# Patient Record
Sex: Male | Born: 1955 | Race: White | Hispanic: No | Marital: Single | Smoking: Never smoker
Health system: Southern US, Community
[De-identification: ages and names within clinical notes are randomized; demographics above are authoritative.]

---

## 2009-07-06 ENCOUNTER — Emergency Department: Payer: Self-pay | Admitting: Internal Medicine

## 2009-07-30 ENCOUNTER — Ambulatory Visit: Payer: Self-pay | Admitting: Orthopedic Surgery

## 2009-08-08 ENCOUNTER — Encounter: Payer: Self-pay | Admitting: Orthopedic Surgery

## 2009-08-23 ENCOUNTER — Encounter: Payer: Self-pay | Admitting: Orthopedic Surgery

## 2010-10-10 NOTE — ED Notes (Signed)
 After ambulating, pt tapped foot on chair and had worsening pain Splint checked - intact, toes pink, sensory intact and able to move them Will give percocet, okay for d/c home after med hold period

## 2010-10-14 ENCOUNTER — Ambulatory Visit: Payer: Self-pay | Admitting: Specialist

## 2010-10-29 ENCOUNTER — Ambulatory Visit: Payer: Self-pay | Admitting: Specialist

## 2011-04-22 ENCOUNTER — Other Ambulatory Visit: Payer: Self-pay | Admitting: Specialist

## 2011-04-28 LAB — WOUND CULTURE

## 2011-10-20 ENCOUNTER — Other Ambulatory Visit: Payer: Self-pay | Admitting: Specialist

## 2011-10-24 LAB — WOUND CULTURE

## 2011-12-29 ENCOUNTER — Ambulatory Visit: Payer: Self-pay | Admitting: Specialist

## 2012-01-04 ENCOUNTER — Ambulatory Visit: Payer: Self-pay | Admitting: Specialist

## 2012-03-27 IMAGING — CT CT EXTREM LOW W/O CM*L*
2 series · 15 of 26 positions shown, 17 images · non-contrast
Comparison: none

REASON FOR EXAM: fx calcaneous eval for surgery
COMMENTS:

PROCEDURE:     KCT - KCT HEEL(CANCANEOUS) LT WO  - October 14, 2010  [DATE]
RESULT:
TECHNIQUE: Multislice helical acquisition through the calcaneus is
reconstructed at high resolution bone window algorithms in the axial,
coronal and sagittal planes at 1.0 mm slice thickness with three dimensional
articulated MIP reconstructions performed. At the time of interpretation,
the [employer] Via software is utilized for additional thin slice reconstructions.

[Series 6: sagittal bone · sagittal · 0.29mm/px · 5 of 95 slices shown, 6 images]
[im 32/95  bone]
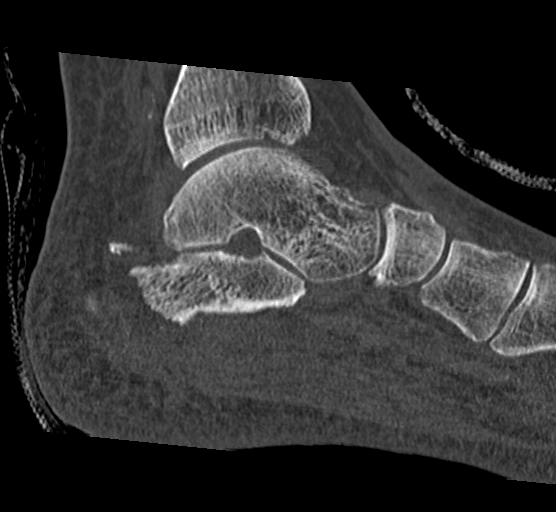
[im 40/95  bone]
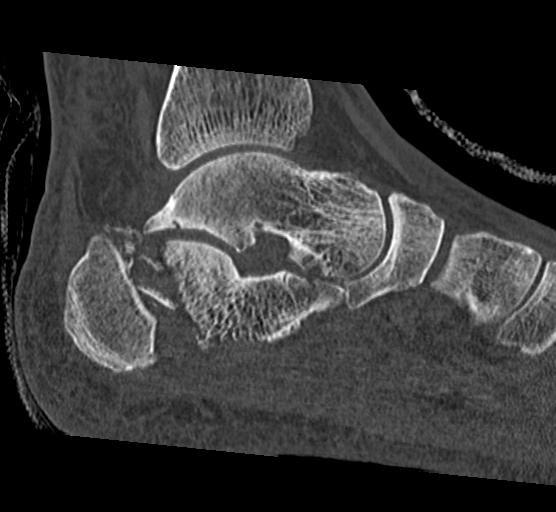
[im 48/95  soft-tissue]
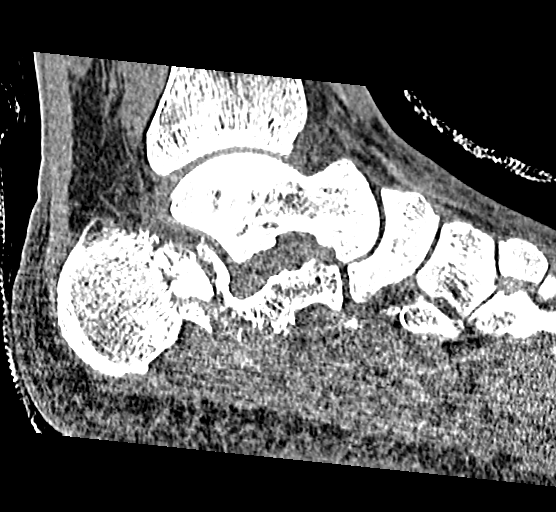
[im 48/95  bone]
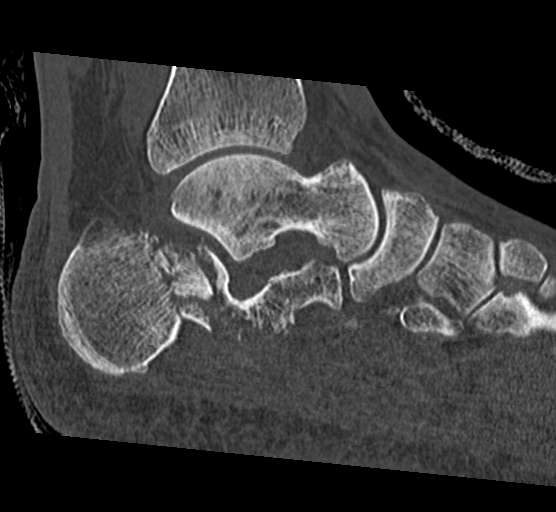
[im 55/95  bone]
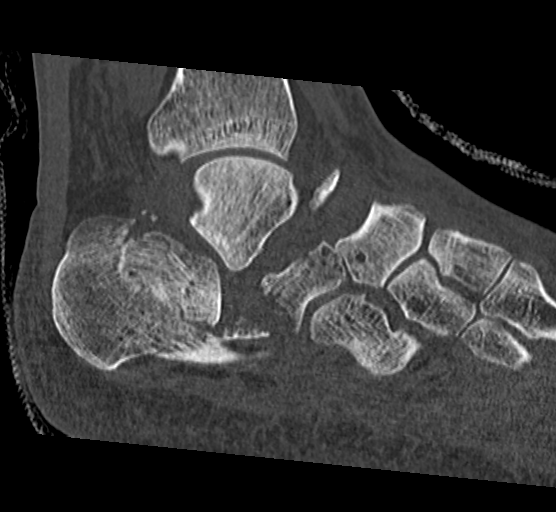
[im 63/95  bone]
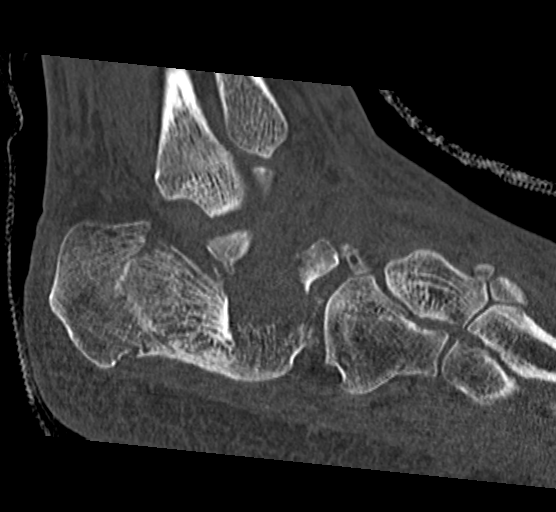

[Series 7: coronal bone · coronal · 0.25mm/px · 10 of 151 slices shown, 11 images]
[im 19/151  soft-tissue]
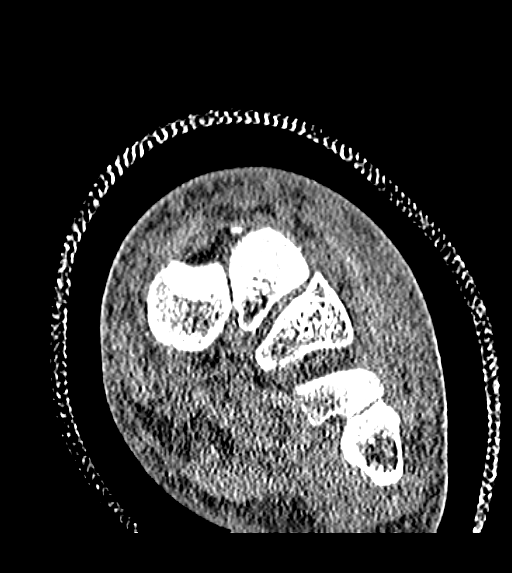
[im 19/151  bone]
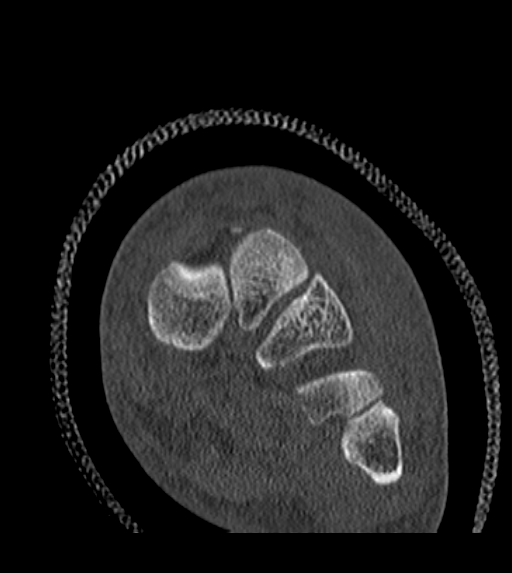
[im 31/151  bone]
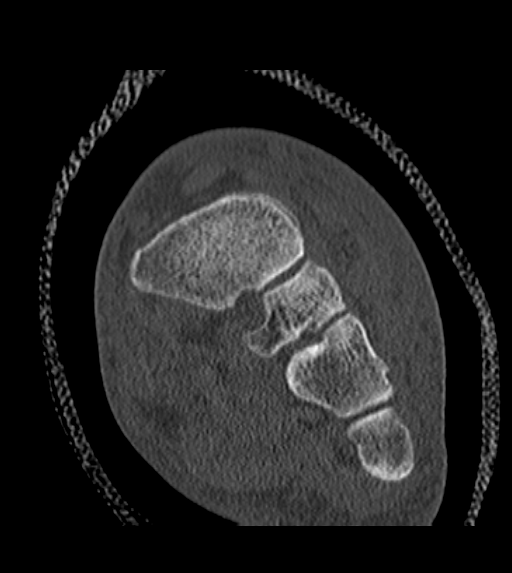
[im 38/151  bone]
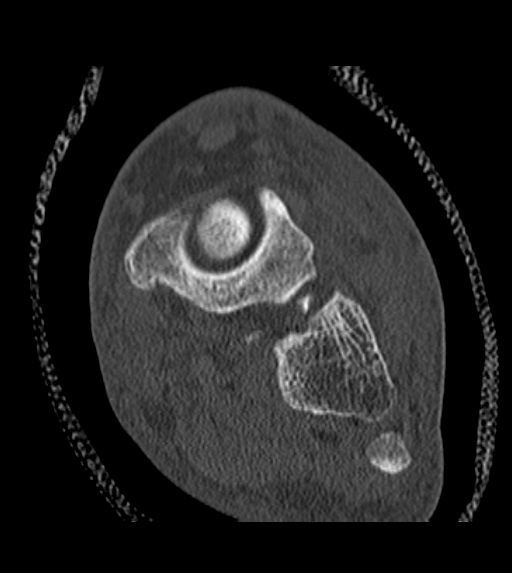
[im 57/151  bone]
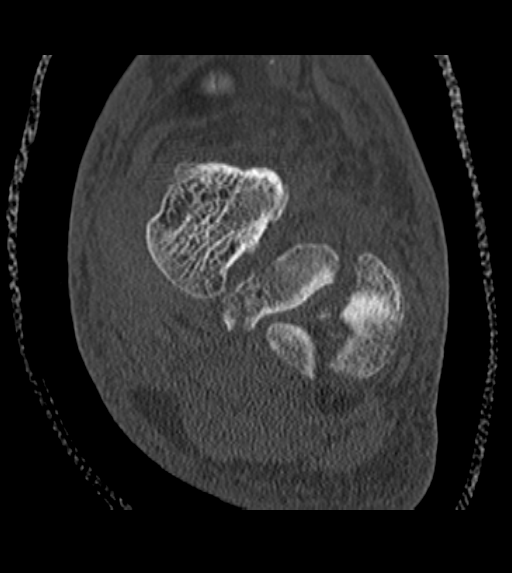
[im 61/151  bone]
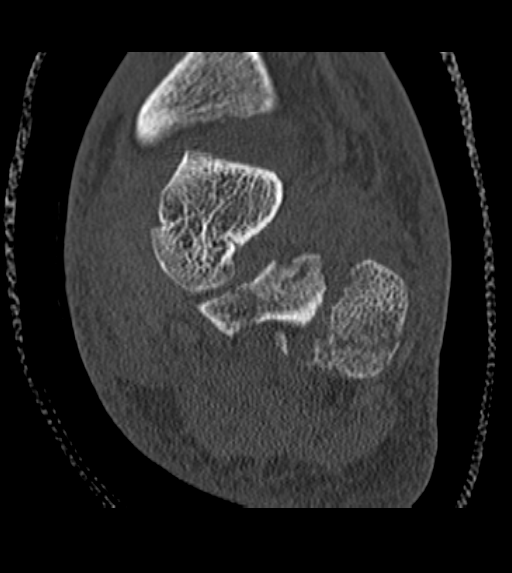
[im 91/151  bone]
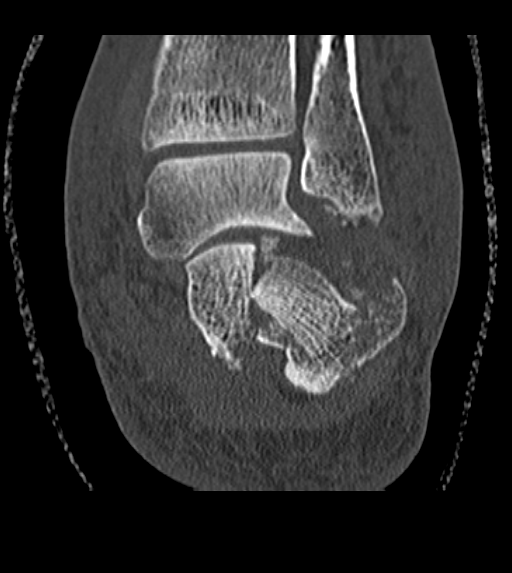
[im 94/151  bone]
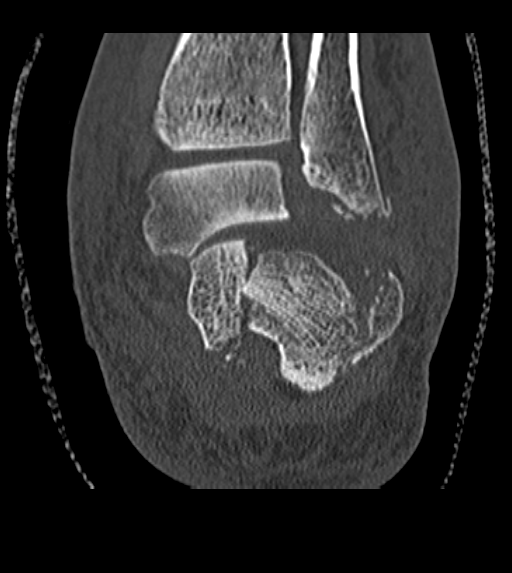
[im 113/151  bone]
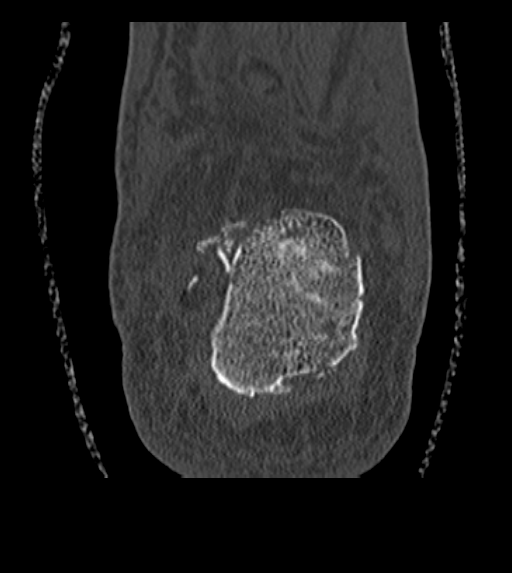
[im 121/151  bone]
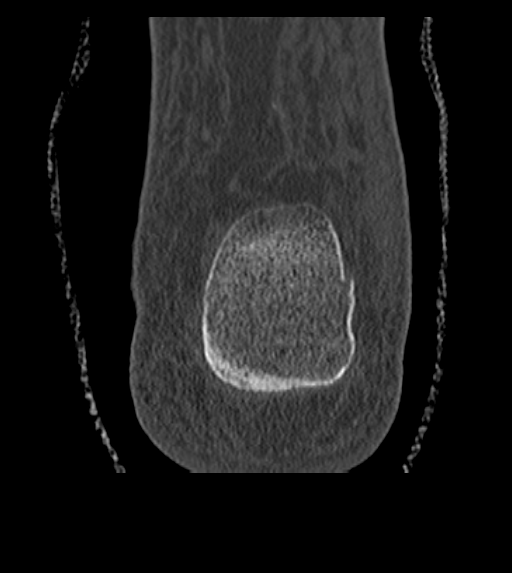
[im 132/151  bone]
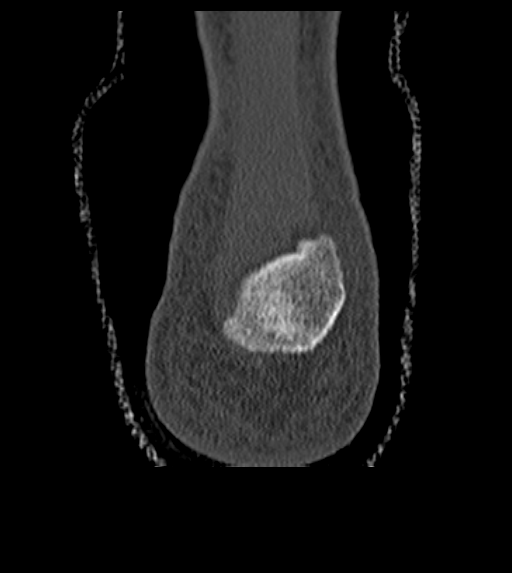

[15 of 26 positions shown; findings below may reference images not displayed]

FINDINGS: There is a severely comminuted fracture in the central portion of
the calcaneus with distraction especially laterally. Numerous, small,
comminuted fragments are demonstrated. There is some depression in the
central portion of the calcaneus inferiorly. There is some widening in a
portion of the subtalar region. The talus itself appears intact with
degenerative changes present. Tiny avulsed bony fragments are seen about the
distal aspect of the lateral malleolus. The other tarsal bones included on
the exam show some degenerative change without definite fracture. The
complex, comminuted fracture in the calcaneus extends to the superior,
inferior, medial, anterior and lateral surfaces without complete extension
to the posterior surface and minimal anterior articular surface involvement.
IMPRESSION: Complex, comminuted calcaneal fracture with avulsed
fragments adjacent to the lateral malleolus.

## 2012-04-11 IMAGING — CR DG HEEL 2V*L*
1 series · 2 of 2 positions shown · non-contrast
Comparison: none

REASON FOR EXAM: post op
COMMENTS:   Bedside (portable):Y

[Series 1: view not recorded · 0.17mm/px · 2 of 2 slices shown]
[im 1/2]
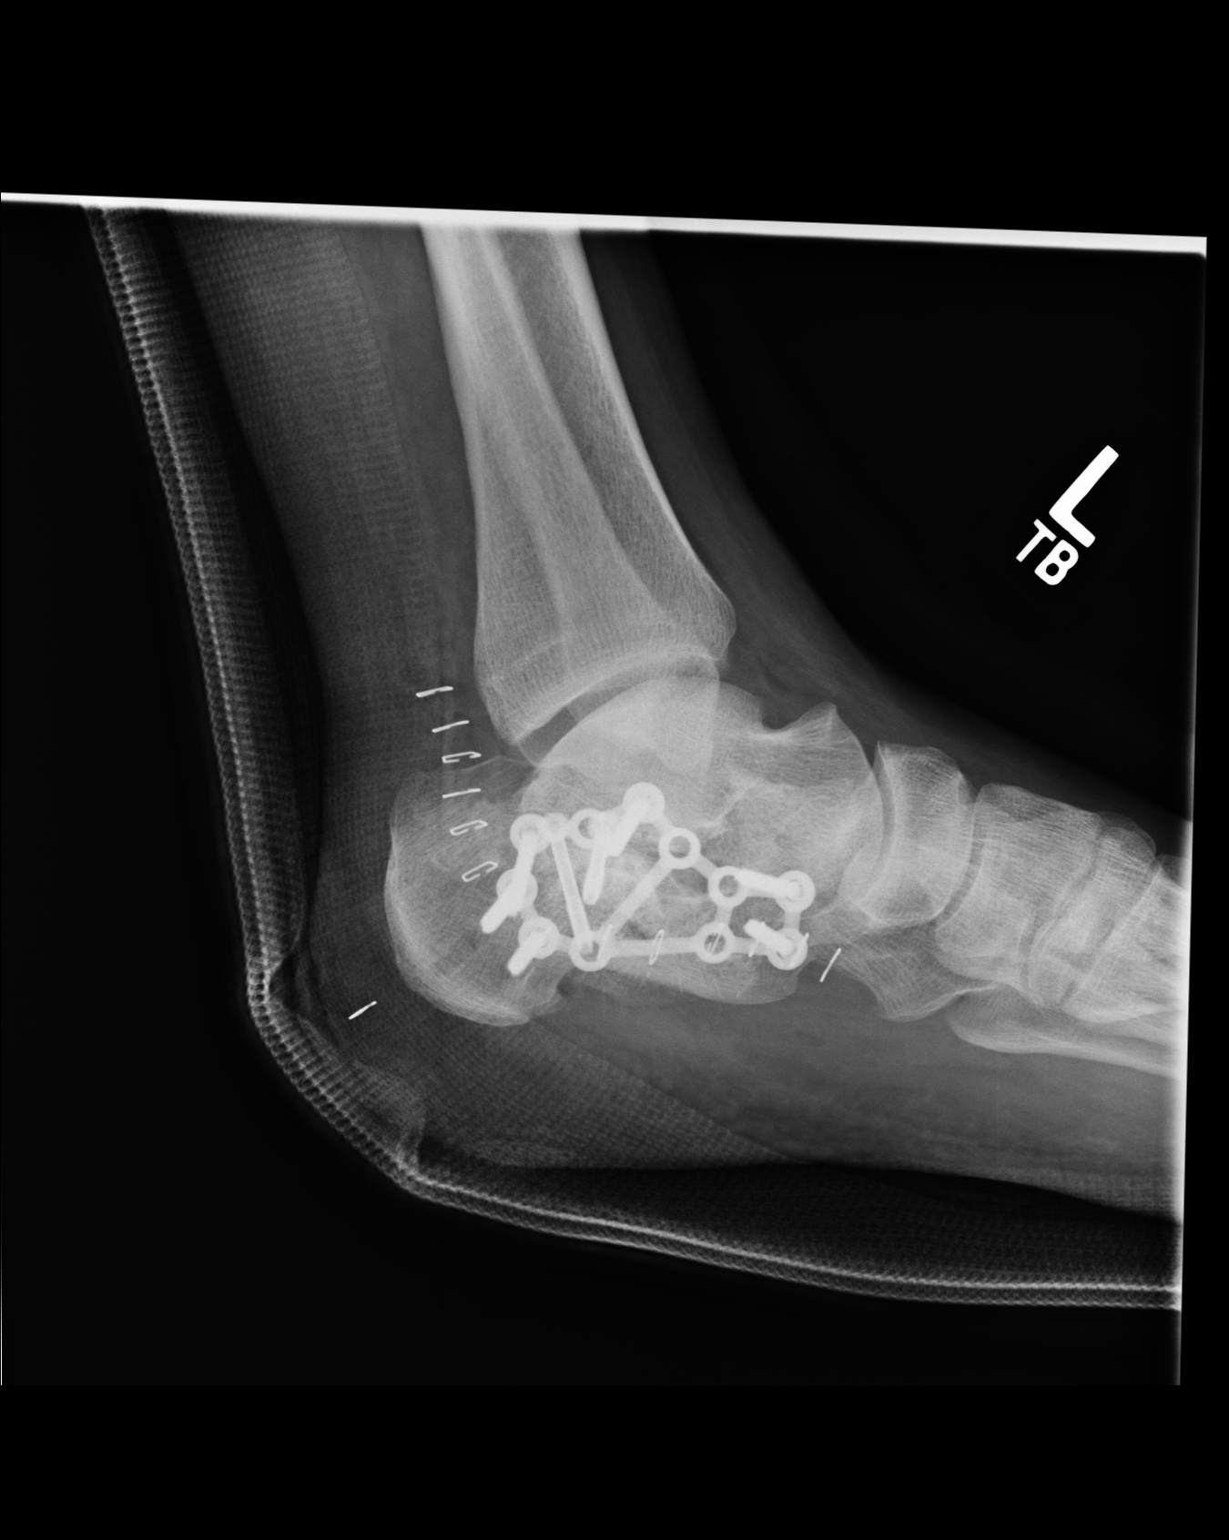
[im 2/2]
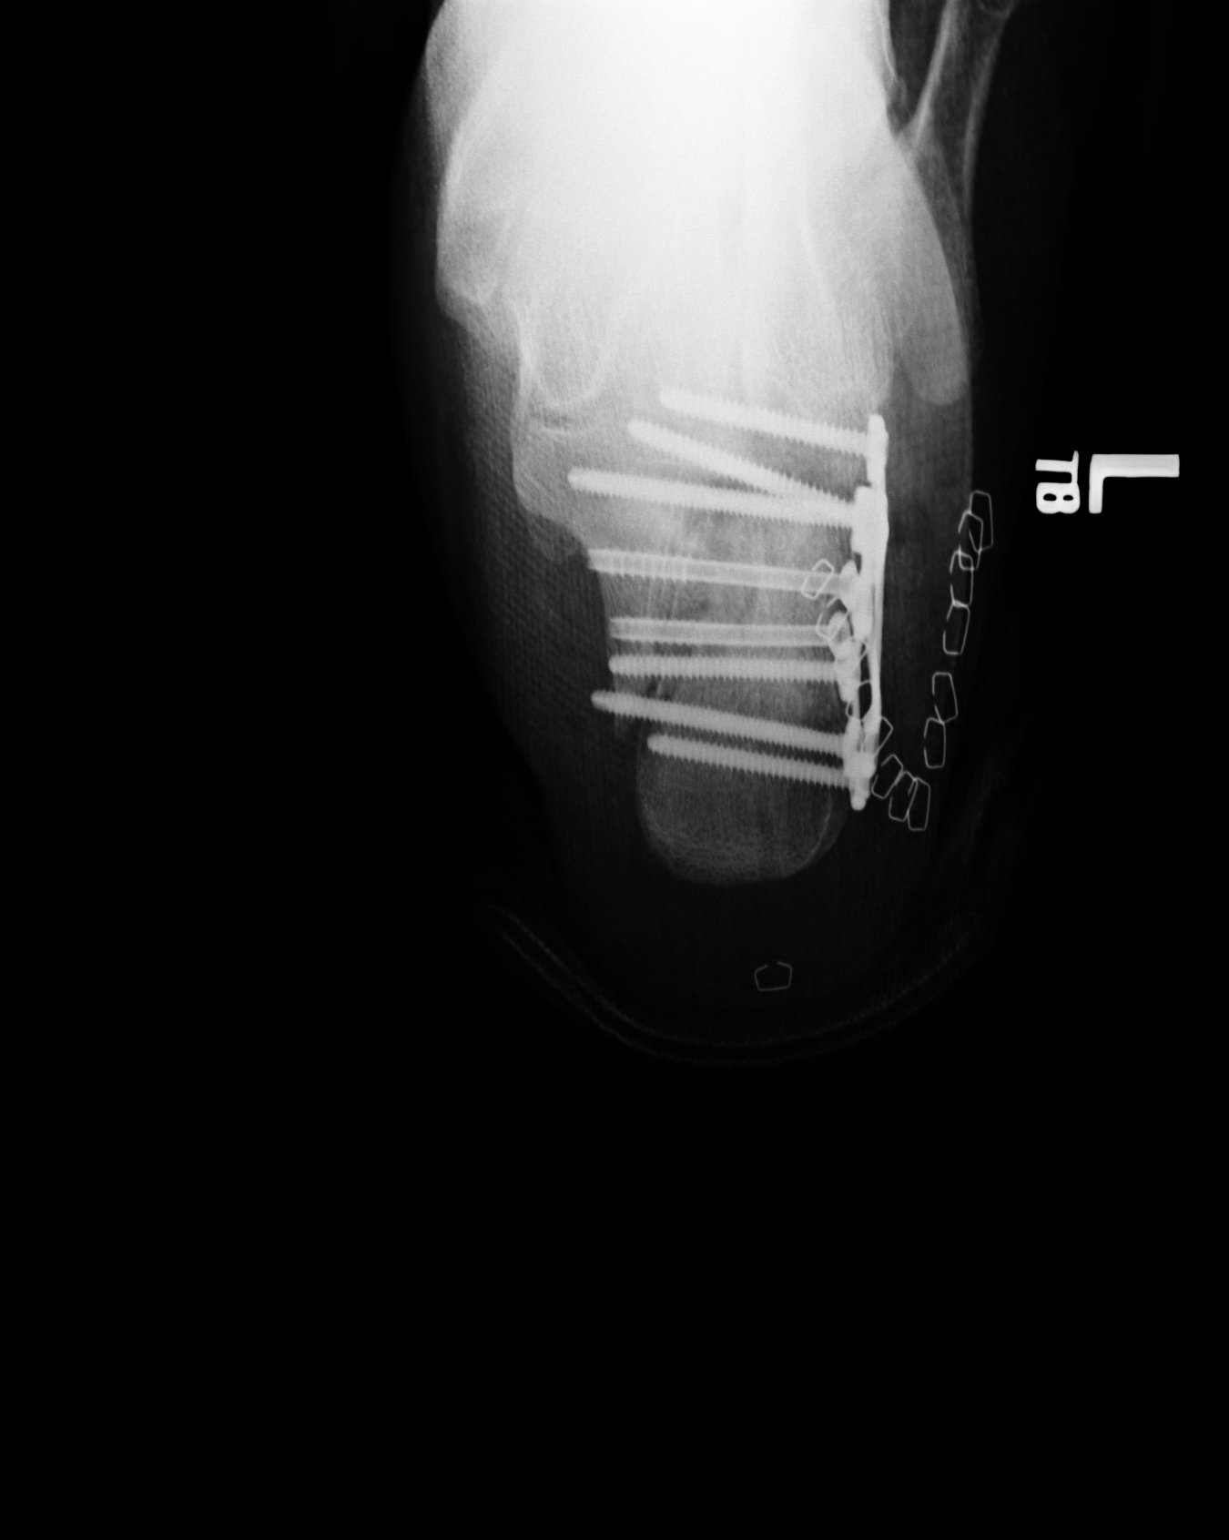

[2 of 2 positions shown; findings below may reference images not displayed]

PROCEDURE:     DXR - DXR CALCANEUOUS-HEEL LEFT  - October 29, 2010 [DATE]

RESULT:     Findings: 2 views of the left calcaneus demonstrates a malleable
sideplate with multiple interlocking screws transfixing a comminuted
calcaneal fracture. The fracture persists consistent with incomplete
healing. There is no other fracture or dislocation. There is a surgical
staple in the posterior calcaneal soft tissues.
IMPRESSION: Please see above.

## 2014-06-12 NOTE — Op Note (Signed)
PATIENT NAME:  John Oneal, Blayden E MR#:  [id number] DATE OF BIRTH:  03-Apr-1955  DATE OF PROCEDURE:  01/04/2012  PREOPERATIVE DIAGNOSIS: Painful hardware left calcaneus.   POSTOPERATIVE DIAGNOSIS: Painful hardware left calcaneus.   PROCEDURE PERFORMED: Deep hardware removal left calcaneus.   SURGEON: Valinda HoarHoward E. Taheem Fricke, M.D.   ANESTHESIA: General LMA.   COMPLICATIONS: None.   DRAINS: One vessel loop.   ESTIMATED BLOOD LOSS: Minimal.   REPLACEMENT: None.   DESCRIPTION OF PROCEDURE: The patient was brought to the operating room where he underwent satisfactory general LMA anesthesia in the supine position. A bolster was placed beneath the left hip and the left foot and lower leg were prepped and draped in sterile fashion. Esmarch was applied and the tourniquet was then inflated to 250 mmHg just above the ankle. A portion of the previous incision was used to re-incise down to bone sharply. The soft tissue was sharply debrided off the bone. The peroneal tendons were identified and were isolated from scar tissue and protected. The prior plate and screws were exposed and then the screws all removed sequentially. The plate was easily removed. Granulation tissue was removed with a rongeur. Two more screws were deep to the plate and these were removed as well. Posteriorly where the patient had had some intermittent drainage there was no sign of infection or other abnormality. A rongeur was used to remove any granulation tissue. The fracture was healed solidly. The subtalar joint was solid, did not move much. The wound was then irrigated and closed with 3-0 Vicryl over a vessel loop drain and 4-0 nylon in the skin. 0.5% Marcaine was placed in the soft tissues and a dry sterile compression dressing with posterior splint was applied. The tourniquet was deflated with good return of blood flow to the foot.   The patient was awakened and taken to recovery in good condition.   ____________________________ Valinda HoarHoward  E. Weslee Fogg, MD hem:bjt D: 01/04/2012 15:28:54 ET T: 01/04/2012 17:17:27 ET JOB#: [id number]  cc: Valinda HoarHoward E. Trula Frede, MD, <Dictator> Valinda HoarHOWARD E Jiles Goya MD ELECTRONICALLY SIGNED 01/05/2012 11:07

## 2020-12-12 NOTE — Progress Notes (Signed)
 Chief Complaint:   Chief Complaint  Patient presents with  . Annual Exam    Subjective:   John Oneal is a 65 y.o. male in today for his annual wellness visit and physical exam.  Medicare Wellness Visit   Providers Rendering Care Dr. Norleen [facility]  Functional Assessment (1) Hearing: Demonstrates no difficulty in hearing during normal conversation (2) Risk of Falls: Patient denies any falls or near falls in the last year (3) Home Safety: Patient feels secure in their home. There are operational smoke alarms in multiple areas of the home. (4) Activities of Daily Living: Independently manages personal grooming and household chores, including cooking, cleaning and laundry.  Manages Personal finances without assistance.    Depression Screening Denies loss of interest in normal activities, has no episodes of weeping or anxiety and reports no changes in appetite or sleep.  PHQ 2/9 from today's flowsheet  PHQ-2 PHQ-2 Over the last 2 weeks, how often have you been bothered by any of the following problems? Little interest or pleasure in doing things: Not At All Feeling down, depressed, or hopeless (or irritable for Teens only)?: Not At All Total Prescreening Score: 0   Total time spent on depression screening was approximately 15 minutes  PHQ-9 (if PHQ >=3) PHQ-9 Over the last 2 weeks, how often have you been bothered by any of the following problems? Total Score =: 0  Depression Severity and Treatment Recommendations:  0-4= None  5-9= Mild / Treatment: Support, educate to call if worse; return in one month  10-14= Moderate / Treatment: Support, watchful waiting; Antidepressant or Psychotherapy  15-19= Moderately severe / Treatment: Antidepressant OR Psychotherapy  >= 20 = Major depression, severe / Antidepressant AND Psychotherapy   Alcohol Screening: Alcohol Use: Not At Risk  . Frequency of Alcohol Consumption: Monthly or less  . Average Number  of Drinks: 1 or 2  . Frequency of Binge Drinking: Never   Total time spent on alcohol screening was approximately 15 minutes.   Cognitive impairment Oriented to person, place and time.  Responses appear appropriate and timely to this observer.   Prevention Plan  Item name                              Frequency        Month Due       Year Due Health Maintenance  Topic Date Due  . Hepatitis C Screen  Never done  . Colorectal Cancer Screening  Never done  . COVID-19 Vaccine (1) Never done  . Adult Tetanus (Td And Tdap)  Never done  . Shingrix (1 of 2) Never done  . Influenza Vaccine (1) 10/24/2020  . Medicare Initial or AWV  12/07/2020  . Pneumococcal Vaccine: 65+ (2 - PPSV23 or PCV20) 12/13/2021  . Depression Screening  12/13/2021  . PSA  12/14/2021  . Diabetes Screening  12/15/2022  . Hib Vaccines  Aged Out  . Hepatitis A Vaccines  Aged Out  . HPV Vaccines  Aged Out    Other personalized health advice Encouraged patient to exercise regularly with a target of 2.5 hours per week.  Encouraged attention to diet with good intake of fruits, vegetables, and limitation of red meat to 2 times a week or less    End of Life Counseling Patient does not have a living will in place.  Patient is a full code.       Current Outpatient  Medications  Medication Sig Dispense Refill  . aspirin 81 MG EC tablet Take 81 mg by mouth once daily    . docosahexaenoic acid/epa (FISH OIL ORAL) Take by mouth    . MULTIVITAMIN ORAL Take by mouth     No current facility-administered medications for this visit.    Allergies as of 12/13/2020  . (No Known Allergies)    History reviewed. No pertinent past medical history.  Past Surgical History:  Procedure Laterality Date  . Heel Surgery       History reviewed. No pertinent family history.  Social History:  reports that he has never smoked. He has never used smokeless tobacco. He reports current alcohol use. He reports previous drug  use.  Results for orders placed or performed in visit on 12/15/19  Comprehensive Metabolic Panel (CMP)  Result Value Ref Range   Glucose 89 70 - 110 mg/dL   Sodium 860 863 - 854 mmol/L   Potassium 4.3 3.6 - 5.1 mmol/L   Chloride 104 97 - 109 mmol/L   Carbon Dioxide (CO2) 28.6 22.0 - 32.0 mmol/L   Urea Nitrogen (BUN) 12 7 - 25 mg/dL   Creatinine 1.0 0.7 - 1.3 mg/dL   Glomerular Filtration Rate (eGFR), MDRD Estimate 75 >60 mL/min/1.73sq m   Calcium 9.4 8.7 - 10.3 mg/dL   AST  30 8 - 39 U/L   ALT  27 6 - 57 U/L   Alk Phos (alkaline Phosphatase) 73 34 - 104 U/L   Albumin 4.2 3.5 - 4.8 g/dL   Bilirubin, Total 0.6 0.3 - 1.2 mg/dL   Protein, Total 6.7 6.1 - 7.9 g/dL   A/G Ratio 1.7 1.0 - 5.0 gm/dL  CBC w/auto Differential (5 Part)  Result Value Ref Range   WBC (White Blood Cell Count) 10.3 (H) 4.1 - 10.2 10^3/uL   RBC (Red Blood Cell Count) 4.95 4.69 - 6.13 10^6/uL   Hemoglobin 14.8 14.1 - 18.1 gm/dL   Hematocrit 55.5 59.9 - 52.0 %   MCV (Mean Corpuscular Volume) 89.7 80.0 - 100.0 fl   MCH (Mean Corpuscular Hemoglobin) 29.9 27.0 - 31.2 pg   MCHC (Mean Corpuscular Hemoglobin Concentration) 33.3 32.0 - 36.0 gm/dL   Platelet Count 820 849 - 450 10^3/uL   RDW-CV (Red Cell Distribution Width) 13.5 11.6 - 14.8 %   MPV (Mean Platelet Volume) 10.8 9.4 - 12.4 fl   Neutrophils 6.53 1.50 - 7.80 10^3/uL   Lymphocytes 2.66 1.00 - 3.60 10^3/uL   Monocytes 0.89 0.00 - 1.50 10^3/uL   Eosinophils 0.17 0.00 - 0.55 10^3/uL   Basophils 0.04 0.00 - 0.09 10^3/uL   Neutrophil % 63.4 32.0 - 70.0 %   Lymphocyte % 25.8 10.0 - 50.0 %   Monocyte % 8.6 4.0 - 13.0 %   Eosinophil % 1.6 1.0 - 5.0 %   Basophil% 0.4 0.0 - 2.0 %   Immature Granulocyte % 0.2 <=0.7 %   Immature Granulocyte Count 0.02 <=0.06 10^3/L  Lipid Panel w/calc LDL  Result Value Ref Range   Cholesterol, Total 209 (H) 100 - 200 mg/dL   Triglyceride 850 35 - 199 mg/dL   HDL (High Density Lipoprotein) Cholesterol 34.6 29.0 - 71.0 mg/dL    LDL Calculated 854 (H) 0 - 130 mg/dL   VLDL Cholesterol 30 mg/dL   Cholesterol/HDL Ratio 6.0   Urinalysis w/Microscopic  Result Value Ref Range   Color Yellow Yellow   Clarity Clear Clear   Specific Gravity <=1.005 1.000 - 1.030   pH,  Urine 6.0 5.0 - 8.0   Protein, Urinalysis Negative Negative, Trace mg/dL   Glucose, Urinalysis Negative Negative mg/dL   Ketones, Urinalysis Negative Negative mg/dL   Blood, Urinalysis Negative Negative   Nitrite, Urinalysis Negative Negative   Leukocyte Esterase, Urinalysis Trace (!) Negative   White Blood Cells, Urinalysis 0-3 None Seen, 0-3 /hpf   Red Blood Cells, Urinalysis None Seen None Seen, 0-3 /hpf   Bacteria, Urinalysis None Seen None Seen /hpf   Squamous Epithelial Cells, Urinalysis Rare Rare, Few, None Seen /hpf  PSA, Total (Screen)  Result Value Ref Range   PSA (Prostate Specific Antigen), Total 0.58 0.10 - 4.00 ng/mL   Narrative   Test results were determined with Beckman Coulter Hybritech Assay. Values obtained with different assay methods cannot be used interchangeably in serial testing. Assay results should not be interpreted as absolute evidence of the presence or absence of malignant disease  Hemoglobin A1C  Result Value Ref Range   Hemoglobin A1C 5.9 (H) 4.2 - 5.6 %   Average Blood Glucose (Calc) 123 mg/dL   Narrative   Normal Range:    4.2 - 5.6% Increased Risk:  5.7 - 6.4% Diabetes:        >= 6.5% Glycemic Control for adults with diabetes:  <7%        ROS:  General: No fever, chills or recent illness. No change in weight Skin:   No skin lesions, growths, masses, rashes, pruritus  HEENT: No change in vision or hearing. No pain or difficulty with swallowing Respiratory: No cough or shortness of breath CV:  No chest pain or palpitations GI:  No pain, dyspepsia or change in bowel habits GU:  No dysuria, frequency, or hesitancy MSK:  No joint pain or injury Neurological: No headaches, changes in mental status, loss of  sensation or strength Endocrine:  No heat or cold intolerance, polydipsia, polyuria  Objective:   Body mass index is 38.91 kg/m.  BP 134/82   Pulse 76   Ht 177.3 cm (5' 9.8)   Wt (!) 122.3 kg (269 lb 9.6 oz)   SpO2 94%   BMI 38.91 kg/m   General: WD/WN male, in no acute distress HEENT: Pupils equal and round, EOMI. oral mucosa moist.  Oropharynx clear. Neck: supple, trachea midline; no thyromegaly Respiratory:clear to auscultation.  No dullness to percussion.  No use of accessory muscles. Cardiac:  Regular rate and rhythm without murmur, gallops, or rubs Vascular: Carotid and radials 2+; distal pulses 2+ Abdominal:soft, nontender, positive bowel sounds.  No organomegaly. Musculoskeletal:  No clubbing, cyanosis or edema.  Full range of motion in upper and lower extremities bilaterally. Neuro: CN grossly intact.  No acute decrease in sensation in the upper and lower extremities bilaterally. Integumental: Moist with no significant rashes or nodules Lymph: no cervical or supraclavicular lymphadenopathy   Assessment/Plan:   Medicare annual wellness visit, initial  (primary encounter diagnosis) Encounter for routine adult physical exam with abnormal findings Need for vaccination Colon cancer screening  Assessment and Plan  1.  Annual wellness visit.  See above documentation. 2.  Annual physical exam.  Referred him for colonoscopy otherwise up-to-date on health maintenance.  Will get screening labs today.    Goals    .  Maintain health/healthy lifestyle (pt-stated)       NORLEEN ALM ROWER, MD  Portions of this note were created using dictation software and may contain typographical errors.

## 2023-01-06 NOTE — Progress Notes (Signed)
 Chief Complaint:   Chief Complaint  Patient presents with  . Annual Exam    Subjective:   John Oneal is a 67 y.o. male in today for his annual wellness visit and physical exam.  Medicare Wellness Visit   Providers Rendering Care Dr. Norleen Kick [facility]  Functional Assessment (1) Hearing: Demonstrates no difficulty in hearing during normal conversation (2) Risk of Falls: Patient denies any falls or near falls in the last year (3) Home Safety: Patient feels secure in their home. There are operational smoke alarms in multiple areas of the home. (4) Activities of Daily Living: Independently manages personal grooming and household chores, including cooking, cleaning and laundry.  Manages Personal finances without assistance.    Depression Screening Denies loss of interest in normal activities, has no episodes of weeping or anxiety and reports no changes in appetite or sleep.  PHQ 2/9 from today's flowsheet  PHQ-2 PHQ-2 Over the last 2 weeks, how often have you been bothered by any of the following problems? Little interest or pleasure in doing things: Not at all Feeling down, depressed, or hopeless: Not at all Patient Health Questionnaire-2 Score: 0  PHQ-9 (if PHQ >=3)    Depression Severity and Treatment Recommendations:  0-4= None  5-9= Mild / Treatment: Support, educate to call if worse; return in one month  10-14= Moderate / Treatment: Support, watchful waiting; Antidepressant or Psychotherapy  15-19= Moderately severe / Treatment: Antidepressant OR Psychotherapy  >= 20 = Major depression, severe / Antidepressant AND Psychotherapy   Alcohol Screening: Alcohol Use: Not At Risk (01/08/2023)   AUDIT-C   . Frequency of Alcohol Consumption: Monthly or less   . Average Number of Drinks: 1 or 2   . Frequency of Binge Drinking: Never   Total time spent on alcohol screening was approximately 15 minutes.   Cognitive impairment Oriented to person,  place and time.  Responses appear appropriate and timely to this observer.   Prevention Plan  Item name                              Frequency        Month Due       Year Due Health Maintenance  Topic Date Due  . Hepatitis C Screen  Never done  . Colorectal Cancer Screening  Never done  . Adult Tetanus (Td And Tdap)  Never done  . Shingrix (1 of 2) Never done  . Pneumococcal Vaccine: 65+ (2 of 2 - PPSV23 or PCV20) 12/13/2021  . COVID-19 Vaccine (1 - 2024-25 season) Never done  . Medicare Initial or AWV  01/03/2023  . Depression Screening  01/08/2024  . PSA  01/05/2025  . Lipid Panel  01/06/2028  . RSV Immunization Pregnant or 60+ (1 - 1-dose 75+ series) 07/29/2029  . Influenza Vaccine  Completed  . Hib Vaccines  Aged Out  . Hepatitis A Vaccines  Aged Out  . Meningococcal ACWY Vaccine  Aged Out  . HPV Vaccines  Aged Out    Other personalized health advice Encouraged patient to exercise regularly with a target of 2.5 hours per week.  Encouraged attention to diet with good intake of fruits, vegetables, and limitation of red meat to 2 times a week or less    End of Life Counseling Patient has a living will in place.  Patient is a full code.       Current Outpatient Medications  Medication Sig Dispense  Refill  . aspirin 81 MG EC tablet Take 81 mg by mouth once daily    . docosahexaenoic acid/epa (FISH OIL ORAL) Take by mouth    . MULTIVITAMIN ORAL Take by mouth    . rosuvastatin (CRESTOR) 10 MG tablet Take 1 tablet (10 mg total) by mouth once daily 30 tablet 11  . UNKNOWN TO PATIENT 3 different eye drops (after cataract surgery)     No current facility-administered medications for this visit.    Allergies as of 01/08/2023  . (No Known Allergies)    Past Medical History:  Diagnosis Date  . Hyperlipidemia 01/02/2022    Past Surgical History:  Procedure Laterality Date  . CATARACT EXTRACTION     Oct 13th and Nov 13th 2024  . Heel Surgery       No family history  on file.  Social History:  reports that he has never smoked. He has never used smokeless tobacco. He reports current alcohol use. He reports that he does not currently use drugs.  Results for orders placed or performed in visit on 01/06/23  Comprehensive Metabolic Panel (CMP)  Result Value Ref Range   Glucose 92 70 - 110 mg/dL   Sodium 857 863 - 854 mmol/L   Potassium 4.5 3.6 - 5.1 mmol/L   Chloride 106 97 - 109 mmol/L   Carbon Dioxide (CO2) 28.6 22.0 - 32.0 mmol/L   Urea Nitrogen (BUN) 14 7 - 25 mg/dL   Creatinine 1.0 0.7 - 1.3 mg/dL   Glomerular Filtration Rate (eGFR) 82 >60 mL/min/1.73sq m   Calcium 9.3 8.7 - 10.3 mg/dL   AST  31 8 - 39 U/L   ALT  19 6 - 57 U/L   Alk Phos (alkaline Phosphatase) 73 34 - 104 U/L   Albumin 4.3 3.5 - 4.8 g/dL   Bilirubin, Total 0.7 0.3 - 1.2 mg/dL   Protein, Total 6.9 6.1 - 7.9 g/dL   A/G Ratio 1.7 1.0 - 5.0 gm/dL  CBC w/auto Differential (5 Part)  Result Value Ref Range   WBC (White Blood Cell Count) 8.4 4.1 - 10.2 10^3/uL   RBC (Red Blood Cell Count) 4.92 4.69 - 6.13 10^6/uL   Hemoglobin 15.1 14.1 - 18.1 gm/dL   Hematocrit 56.2 59.9 - 52.0 %   MCV (Mean Corpuscular Volume) 88.8 80.0 - 100.0 fl   MCH (Mean Corpuscular Hemoglobin) 30.7 27.0 - 31.2 pg   MCHC (Mean Corpuscular Hemoglobin Concentration) 34.6 32.0 - 36.0 gm/dL   Platelet Count 820 849 - 450 10^3/uL   RDW-CV (Red Cell Distribution Width) 13.9 11.6 - 14.8 %   MPV (Mean Platelet Volume) 10.1 9.4 - 12.4 fl   Neutrophils 5.55 1.50 - 7.80 10^3/uL   Lymphocytes 1.81 1.00 - 3.60 10^3/uL   Monocytes 0.69 0.00 - 1.50 10^3/uL   Eosinophils 0.27 0.00 - 0.55 10^3/uL   Basophils 0.04 0.00 - 0.09 10^3/uL   Neutrophil % 66.4 32.0 - 70.0 %   Lymphocyte % 21.6 10.0 - 50.0 %   Monocyte % 8.2 4.0 - 13.0 %   Eosinophil % 3.2 1.0 - 5.0 %   Basophil% 0.5 0.0 - 2.0 %   Immature Granulocyte % 0.1 <=0.7 %   Immature Granulocyte Count 0.01 <=0.06 10^3/L  Hemoglobin A1C  Result Value Ref Range    Hemoglobin A1C 6.0 (H) 4.2 - 5.6 %   Average Blood Glucose (Calc) 126 mg/dL   Narrative   Normal Range:    4.2 - 5.6% Increased Risk:  5.7 -  6.4% Diabetes:        >= 6.5% Glycemic Control for adults with diabetes:  <7%    Lipid Panel w/calc LDL  Result Value Ref Range   Cholesterol, Total 164 100 - 200 mg/dL   Triglyceride 866 35 - 199 mg/dL   HDL (High Density Lipoprotein) Cholesterol 35.1 29.0 - 71.0 mg/dL   LDL Calculated 897 0 - 130 mg/dL   VLDL Cholesterol 27 mg/dL   Cholesterol/HDL Ratio 4.7   Urinalysis w/Microscopic  Result Value Ref Range   Color Yellow Colorless, Straw, Light Yellow, Yellow, Dark Yellow   Clarity Clear Clear   Specific Gravity 1.028 1.005 - 1.030   pH, Urine 6.0 5.0 - 8.0   Protein, Urinalysis Trace (!) Negative mg/dL   Glucose, Urinalysis Negative Negative mg/dL   Ketones, Urinalysis Negative Negative mg/dL   Blood, Urinalysis Negative Negative   Nitrite, Urinalysis Negative Negative   Leukocyte Esterase, Urinalysis 1+ (!) Negative   Bilirubin, Urinalysis Negative Negative   Urobilinogen, Urinalysis 2.0 (H) 0.2 - 1.0 mg/dL   WBC, UA 14 (H) <=5 /hpf   Red Blood Cells, Urinalysis 1 <=3 /hpf   Bacteria, Urinalysis 0-5 0 - 5 /hpf   Squamous Epithelial Cells, Urinalysis 0 /hpf   Calcium Oxalate Crystals PRESENT (!) None Seen  PSA, Total (Screen)  Result Value Ref Range   PSA (Prostate Specific Antigen), Total 3.04 0.10 - 4.00 ng/mL   Narrative   Test results were determined with Beckman Coulter Hybritech Assay. Values obtained with different assay methods cannot be used interchangeably in serial testing. Assay results should not be interpreted as absolute evidence of the presence or absence of malignant disease      ROS:  General: No fever, chills or recent illness. No change in weight Skin:   No skin lesions, growths, masses, rashes, pruritus  HEENT: No change in vision or hearing. No pain or difficulty with swallowing Respiratory: No cough or  shortness of breath CV:  No chest pain or palpitations GI:  No pain, dyspepsia or change in bowel habits GU:  No dysuria, frequency, or hesitancy MSK:  No joint pain or injury Neurological: No headaches, changes in mental status, loss of sensation or strength Endocrine:  No heat or cold intolerance, polydipsia, polyuria  Objective:   Body mass index is 39.4 kg/m.  BP 130/60   Pulse 75   Ht 177.3 cm (5' 9.8)   Wt (!) 123.8 kg (273 lb)   SpO2 96%   BMI 39.40 kg/m   General: WD/WN male, in no acute distress HEENT: Pupils equal and round, EOMI. oral mucosa moist.  Oropharynx clear. Neck: supple, trachea midline; no thyromegaly Respiratory:clear to auscultation.  No dullness to percussion.  No use of accessory muscles. Cardiac:  Regular rate and rhythm without murmur, gallops, or rubs Vascular: Carotid and radials 2+; distal pulses 2+ Abdominal:soft, nontender, positive bowel sounds.  No organomegaly. Musculoskeletal:  No clubbing, cyanosis or edema.  Full range of motion in upper and lower extremities bilaterally. Neuro: CN grossly intact.  No acute decrease in sensation in the upper and lower extremities bilaterally. Integumental: Moist with no significant rashes or nodules Lymph: no cervical or supraclavicular lymphadenopathy   Assessment/Plan:   Medicare annual wellness visit, subsequent  (primary encounter diagnosis) Encounter for routine adult physical exam with abnormal findings Hyperlipidemia, unspecified hyperlipidemia type Prediabetes Need for immunization against influenza  Assessment and Plan  1.  Annual wellness visit.  See above documentation. 2.  Annual physical exam.  He has a Cologuard test at home that his insurance company sent him.  Otherwise up-to-date with health maintenance.  Reviewed lab results. 3.  Hyperlipidemia.  Lipid panel is within parameters.  Continue statin therapy. 4.  Prediabetes.  Hemoglobin A1c is 6.0.    Goals     . * Maintain  health/healthy lifestyle (pt-stated)    . * Maintain health/healthy lifestyle (pt-stated)       Goals       Patient Stated   . * Maintain health/healthy lifestyle (pt-stated)               NORLEEN ALM ROWER, MD  Portions of this note were created using dictation software and may contain typographical errors.  *Some images could not be shown.

## 2024-01-13 NOTE — Progress Notes (Signed)
 Chief Complaint:   Chief Complaint  Patient presents with  . Annual Exam    Subjective:   John Oneal is a 68 y.o. male in today for his annual wellness visit and physical exam.  Medicare Wellness Visit   Providers Rendering Care Dr. Norleen Kick [facility]  Functional Assessment (1) Hearing: Demonstrates no difficulty in hearing during normal conversation (2) Risk of Falls: Patient denies any falls or near falls in the last year (3) Home Safety: Patient feels secure in their home. There are operational smoke alarms in multiple areas of the home. (4) Activities of Daily Living: Independently manages personal grooming and household chores, including cooking, cleaning and laundry.  Manages Personal finances without assistance.    Depression Screening Denies loss of interest in normal activities, has no episodes of weeping or anxiety and reports no changes in appetite or sleep.  PHQ 2/9 from today's flowsheet  PHQ-2 PHQ-2 Over the last 2 weeks, how often have you been bothered by any of the following problems? Little interest or pleasure in doing things: Not at all Feeling down, depressed, or hopeless: Not at all Patient Health Questionnaire-2 Score: 0  PHQ-9 (if PHQ >=3)    Depression Severity and Treatment Recommendations:  0-4= None  5-9= Mild / Treatment: Support, educate to call if worse; return in one month  10-14= Moderate / Treatment: Support, watchful waiting; Antidepressant or Psychotherapy  15-19= Moderately severe / Treatment: Antidepressant OR Psychotherapy  >= 20 = Major depression, severe / Antidepressant AND Psychotherapy   Alcohol Screening: Alcohol Use: Not At Risk (01/14/2024)   AUDIT-C   . Frequency of Alcohol Consumption: Monthly or less   . Average Number of Drinks: 1 or 2   . Frequency of Binge Drinking: Never   Total time spent on alcohol screening was approximately 15 minutes.   Cognitive impairment Oriented to person,  place and time.  Responses appear appropriate and timely to this observer.   Prevention Plan  Item name                              Frequency        Month Due       Year Due Health Maintenance  Topic Date Due  . Hepatitis C Screen  Never done  . Colorectal Cancer Screening  Never done  . Adult Tetanus (Td And Tdap)  Never done  . Shingrix (1 of 2) Never done  . Pneumococcal Vaccine: 50+ (2 of 2 - PCV20 or PCV21) 12/13/2021  . COVID-19 Vaccine (1 - 2025-26 season) Never done  . Medicare Subsequent AWV [id number]  01/09/2024  . Annual Physical/Well Child Check  01/09/2024  . Depression Screening  01/13/2025  . PSA  01/06/2026  . Lipid Panel  01/06/2029  . RSV Immunization Pregnant or 50+ (1 - 1-dose 75+ series) 07/29/2029  . Medicare Initial AWV [id number]  Completed  . Influenza Vaccine  Completed  . Hib Vaccines  Aged Out  . Hepatitis A Vaccines  Aged Out  . Meningococcal B Vaccine  Aged Out  . Meningococcal ACWY Vaccine  Aged Out  . HPV Vaccines  Aged Out    Other personalized health advice Encouraged patient to exercise regularly with a target of 2.5 hours per week.  Encouraged attention to diet with good intake of fruits, vegetables, and limitation of red meat to 2 times a week or less    End of Life Counseling Patient has  a living will in place.  Patient is a full code.       Current Outpatient Medications  Medication Sig Dispense Refill  . aspirin 81 MG EC tablet Take 81 mg by mouth once daily    . docosahexaenoic acid/epa (FISH OIL ORAL) Take by mouth    . MULTIVITAMIN ORAL Take by mouth    . rosuvastatin (CRESTOR) 10 MG tablet Take 1 tablet (10 mg total) by mouth once daily 30 tablet 11  . UNKNOWN TO PATIENT 3 different eye drops (after cataract surgery)     No current facility-administered medications for this visit.    Allergies as of 01/14/2024  . (No Known Allergies)    Past Medical History:  Diagnosis Date  . Hyperlipidemia 01/02/2022    Past Surgical  History:  Procedure Laterality Date  . CATARACT EXTRACTION     Oct 13th and Nov 13th 2024  . Heel Surgery       No family history on file.  Social History:  reports that he has never smoked. He has never used smokeless tobacco. He reports current alcohol use. He reports that he does not currently use drugs.  Results for orders placed or performed in visit on 01/07/24  Comprehensive Metabolic Panel (CMP)  Result Value Ref Range   Glucose 104 70 - 110 mg/dL   Sodium 859 863 - 854 mmol/L   Potassium 4.2 3.6 - 5.1 mmol/L   Chloride 106 97 - 109 mmol/L   Carbon Dioxide (CO2) 26.5 22.0 - 32.0 mmol/L   Urea Nitrogen (BUN) 14 7 - 25 mg/dL   Creatinine 1.0 0.7 - 1.3 mg/dL   Glomerular Filtration Rate (eGFR) 81 >60 mL/min/1.73sq m   Calcium 9.0 8.7 - 10.3 mg/dL   AST  21 8 - 39 U/L   ALT  16 6 - 57 U/L   Alk Phos (alkaline Phosphatase) 74 34 - 104 U/L   Albumin 4.3 3.5 - 4.8 g/dL   Bilirubin, Total 0.8 0.3 - 1.2 mg/dL   Protein, Total 6.7 6.1 - 7.9 g/dL   A/G Ratio 1.8 1.0 - 5.0 gm/dL  CBC w/auto Differential (5 Part)  Result Value Ref Range   WBC (White Blood Cell Count) 8.0 4.1 - 10.2 10^3/uL   RBC (Red Blood Cell Count) 4.98 4.69 - 6.13 10^6/uL   Hemoglobin 14.7 14.1 - 18.1 gm/dL   Hematocrit 56.3 59.9 - 52.0 %   MCV (Mean Corpuscular Volume) 87.6 80.0 - 100.0 fl   MCH (Mean Corpuscular Hemoglobin) 29.5 27.0 - 31.2 pg   MCHC (Mean Corpuscular Hemoglobin Concentration) 33.7 32.0 - 36.0 gm/dL   Platelet Count 820 849 - 450 10^3/uL   RDW-CV (Red Cell Distribution Width) 14.1 11.6 - 14.8 %   MPV (Mean Platelet Volume) 10.7 9.4 - 12.4 fl   Neutrophils 5.17 1.50 - 7.80 10^3/uL   Lymphocytes 1.83 1.00 - 3.60 10^3/uL   Monocytes 0.67 0.00 - 1.50 10^3/uL   Eosinophils 0.28 0.00 - 0.55 10^3/uL   Basophils 0.04 0.00 - 0.09 10^3/uL   Neutrophil % 64.4 32.0 - 70.0 %   Lymphocyte % 22.8 10.0 - 50.0 %   Monocyte % 8.4 4.0 - 13.0 %   Eosinophil % 3.5 1.0 - 5.0 %   Basophil% 0.5 0.0 - 2.0  %   Immature Granulocyte % 0.4 <=0.7 %   Immature Granulocyte Count 0.03 <=0.06 10^3/L  Hemoglobin A1C  Result Value Ref Range   Hemoglobin A1C 5.6 4.2 - 5.6 %  Average Blood Glucose (Calc) 114 mg/dL   Narrative   Normal Range:    4.2 - 5.6% Increased Risk:  5.7 - 6.4% Diabetes:        >= 6.5% Glycemic Control for adults with diabetes:  <7%    Lipid Panel w/calc LDL  Result Value Ref Range   Cholesterol, Total 217 (H) 100 - 200 mg/dL   Triglyceride 825 35 - 199 mg/dL   HDL (High Density Lipoprotein) Cholesterol 35.3 29.0 - 71.0 mg/dL   LDL Calculated 852 (H) 0 - 130 mg/dL   VLDL Cholesterol 35 mg/dL   Cholesterol/HDL Ratio 6.1   PSA, Total (Screen)  Result Value Ref Range   PSA (Prostate Specific Antigen), Total 1.27 0.10 - 4.00 ng/mL   Narrative   Test results were determined with Beckman Coulter Hybritech Assay. Values obtained with different assay methods cannot be used interchangeably in serial testing. Assay results should not be interpreted as absolute evidence of the presence or absence of malignant disease  Urinalysis w/Microscopic  Result Value Ref Range   Color Yellow Colorless, Straw, Light Yellow, Yellow, Dark Yellow   Clarity Clear Clear   Specific Gravity 1.027 1.005 - 1.030   pH, Urine 5.5 5.0 - 8.0   Protein, Urinalysis Negative Negative mg/dL   Glucose, Urinalysis Negative Negative mg/dL   Ketones, Urinalysis Negative Negative mg/dL   Blood, Urinalysis Negative Negative   Nitrite, Urinalysis Negative Negative   Leukocyte Esterase, Urinalysis Negative Negative   Bilirubin, Urinalysis Negative Negative   Urobilinogen, Urinalysis 0.2 0.2 - 1.0 mg/dL   WBC, UA 6 (H) <=5 /hpf   Red Blood Cells, Urinalysis <1 <=3 /hpf   Bacteria, Urinalysis 0-5 0 - 5 /hpf   Squamous Epithelial Cells, Urinalysis 0 /hpf      ROS:  General: No fever, chills or recent illness. No change in weight Skin:   No skin lesions, growths, masses, rashes, pruritus  HEENT: No change in  vision or hearing. No pain or difficulty with swallowing Respiratory: No cough or shortness of breath CV:  No chest pain or palpitations GI:  No pain, dyspepsia or change in bowel habits GU:  No dysuria, frequency, or hesitancy MSK:  No joint pain or injury Neurological: No headaches, changes in mental status, loss of sensation or strength Endocrine:  No heat or cold intolerance, polydipsia, polyuria  Objective:   Body mass index is 38.51 kg/m.  BP 130/80   Pulse 73   Ht 175.3 cm (5' 9)   Wt (!) 118.3 kg (260 lb 12.8 oz)   SpO2 97%   BMI 38.51 kg/m   General: WD/WN male, in no acute distress HEENT: Pupils equal and round, EOMI. oral mucosa moist.  Oropharynx clear. Neck: supple, trachea midline; no thyromegaly Respiratory:clear to auscultation.  No dullness to percussion.  No use of accessory muscles. Cardiac:  Regular rate and rhythm without murmur, gallops, or rubs Vascular: Carotid and radials 2+; distal pulses 2+ Abdominal:soft, nontender, positive bowel sounds.  No organomegaly. Musculoskeletal:  No clubbing, cyanosis or edema.  Full range of motion in upper and lower extremities bilaterally. Neuro: CN grossly intact.  No acute decrease in sensation in the upper and lower extremities bilaterally. Integumental: Moist with no significant rashes or nodules Lymph: no cervical or supraclavicular lymphadenopathy   Assessment/Plan:   Medicare annual wellness visit, subsequent  (primary encounter diagnosis) Encounter for routine adult physical exam with abnormal findings Hyperlipidemia, unspecified hyperlipidemia type Prediabetes Depression screening (Z13.31) Prostate cancer screening Need for influenza  vaccination  Assessment and Plan  1.  Annual wellness visit.  See above documentation. 2.  Annual physical exam.  Patient is up-to-date with health maintenance.  Reviewed lab results. 3.  Hyperlipidemia.  Lipid panel is up.  He stopped taking the Crestor.  Have  represcribed it. 4.  Prediabetes.  Hemoglobin A1c is 5.6.    Goals     . * Maintain health/healthy lifestyle (pt-stated)    . * Maintain health/healthy lifestyle (pt-stated)       Goals       Patient Stated   . * Maintain health/healthy lifestyle (pt-stated)               NORLEEN ALM ROWER, MD  Portions of this note were created using dictation software and may contain typographical errors.  *Some images could not be shown.

## 2024-01-20 ENCOUNTER — Emergency Department

## 2024-01-20 ENCOUNTER — Emergency Department: Admission: EM | Admit: 2024-01-20 | Discharge: 2024-01-20 | Disposition: A | Discharge status: Home / Self Care

## 2024-01-20 ENCOUNTER — Other Ambulatory Visit: Payer: Self-pay

## 2024-01-20 ENCOUNTER — Encounter: Payer: Self-pay | Admitting: Emergency Medicine

## 2024-01-20 DIAGNOSIS — L03211 Cellulitis of face: Secondary | ICD-10-CM | POA: Insufficient documentation

## 2024-01-20 DIAGNOSIS — R200 Anesthesia of skin: Secondary | ICD-10-CM | POA: Diagnosis present

## 2024-01-20 DIAGNOSIS — K0889 Other specified disorders of teeth and supporting structures: Secondary | ICD-10-CM | POA: Insufficient documentation

## 2024-01-20 LAB — BASIC METABOLIC PANEL WITH GFR
Anion gap: 10 (ref 5–15)
BUN: 11 mg/dL (ref 8–23)
CO2: 24 mmol/L (ref 22–32)
Calcium: 9.2 mg/dL (ref 8.9–10.3)
Chloride: 107 mmol/L (ref 98–111)
Creatinine, Ser: 0.95 mg/dL (ref 0.61–1.24)
GFR, Estimated: >60 mL/min (ref >60)
Glucose, Bld: 129 mg/dL — ABNORMAL HIGH (ref 70–99)
Potassium: 3.8 mmol/L (ref 3.5–5.1)
Sodium: 140 mmol/L (ref 135–145)

## 2024-01-20 LAB — CBC WITH DIFFERENTIAL/PLATELET
Abs Immature Granulocytes: 0.03 K/uL (ref 0.00–0.07)
Basophils Absolute: 0 K/uL (ref 0.0–0.1)
Basophils Relative: 0 %
Eosinophils Absolute: 0.2 K/uL (ref 0.0–0.5)
Eosinophils Relative: 2 %
HCT: 42.9 % (ref 39.0–52.0)
Hemoglobin: 14.8 g/dL (ref 13.0–17.0)
Immature Granulocytes: 0 %
Lymphocytes Relative: 18 %
Lymphs Abs: 1.9 K/uL (ref 0.7–4.0)
MCH: 29.4 pg (ref 26.0–34.0)
MCHC: 34.5 g/dL (ref 30.0–36.0)
MCV: 85.3 fL (ref 80.0–100.0)
Monocytes Absolute: 0.9 K/uL (ref 0.1–1.0)
Monocytes Relative: 8 %
Neutro Abs: 7.4 K/uL (ref 1.7–7.7)
Neutrophils Relative %: 72 %
Platelets: 174 K/uL (ref 150–400)
RBC: 5.03 MIL/uL (ref 4.22–5.81)
RDW: 13.7 % (ref 11.5–15.5)
WBC: 10.4 K/uL (ref 4.0–10.5)
nRBC: 0 % (ref 0.0–0.2)

## 2024-01-20 LAB — PROCALCITONIN: Procalcitonin: 0.11 ng/mL

## 2024-01-20 MED ORDER — AMOXICILLIN-POT CLAVULANATE 875-125 MG PO TABS: 1.0000 | ORAL_TABLET | Freq: Two times a day (BID) | ORAL | 0 refills | Status: AC

## 2024-01-20 MED ORDER — CHLORHEXIDINE GLUCONATE 0.12 % MT SOLN: 15.0000 mL | Freq: Two times a day (BID) | OROMUCOSAL | 0 refills | Status: AC

## 2024-01-20 MED ORDER — IOHEXOL 350 MG/ML SOLN
75.0000 mL | Freq: Once | INTRAVENOUS | Status: AC | PRN
  Administered 2024-01-20: 75 mL via INTRAVENOUS

## 2024-01-20 MED ORDER — AMOXICILLIN-POT CLAVULANATE 875-125 MG PO TABS
1.0000 | ORAL_TABLET | Freq: Once | ORAL | Status: AC
  Administered 2024-01-20: 1 via ORAL
  Filled 2024-01-20: qty 1

## 2024-01-20 NOTE — ED Triage Notes (Signed)
 C/O right facial numbness. Onset yesterday in the early morning.  Also noticed right facial swelling around jaw.    Equal sensation bilaterally. Facial swelling to right jaw line. Speech clear. Left facial droop noted.

## 2024-01-20 NOTE — Discharge Instructions (Signed)
 1) you were seen in the emergency department for over 48 hours of right facial swelling, numbness and dental pain.  Workup demonstrated likely dental infection however given the numbness a stroke workup was completed demonstrated no acute stroke.  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  If you do not please let your primary care physician and dentist know immediately.  Return with any acutely worsening symptoms or any other emergency -- RETURN PRECAUTIONS & AFTERCARE: (ENGLISH) RETURN PRECAUTIONS: Return immediately to the emergency department or see/call your doctor if you feel worse, weak or have changes in speech or vision, are short of breath, have fever, vomiting, pain, bleeding or dark stool, trouble urinating or any new issues. Return here or see/call your doctor if not improving as expected for your suspected condition. FOLLOW-UP CARE: Call your doctor and/or any doctors we referred you to for more advice and to make an appointment. Do this today, tomorrow or after the weekend. Some doctors only take PPO insurance so if you have HMO insurance you may want to contact your HMO or your regular doctor for referral to a specialist within your plan. Either way tell the doctor's office that it was a referral from the emergency department so you get the soonest possible appointment.  YOUR TEST RESULTS: Take result reports of any blood or urine tests, imaging tests and EKG's to your doctor and any referral doctor. Have any abnormal tests repeated. Your doctor or a referral doctor can let you know when this should be done. Also make sure your doctor contacts this hospital to get any test results that are not currently available such as cultures or special tests for infection and final imaging reports,  which are often not available at the time you leave the ER but which may list additional important findings that are not documented on the preliminary report. BLOOD PRESSURE: If your blood pressure was greater than 120/80 have your blood pressure rechecked within 1 to 2 weeks. MEDICATION SIDE EFFECTS: Do not drive, walk, bike, take the bus, etc. if you have received or are being prescribed any sedating medications such as those for pain or anxiety or certain antihistamines like Benadryl. If you have been give one of these here get a taxi home or have a friend drive you home. Ask your pharmacist to counsel you on potential side effects of any new medication

## 2024-01-20 NOTE — ED Notes (Signed)
 Pt to MRI at this time.

## 2024-01-20 NOTE — ED Provider Notes (Signed)
 [facility] Provider Note    Event Date/Time   First MD Initiated Contact with Patient 01/20/24 [phone]     (approximate)   History   Numbness   HPI  John Oneal is a 68 y.o. male with hyperlipidemia who sees a primary care physician regularly presents with 48 hours of right mandibular dental pain, right facial numbness and right facial weakness.  Dental pain initiated more than 48 hours ago, denies any new cracked teeth but has poor dentition at baseline and wears a partial in his maxillary teeth.  He did place Orajel over the tooth. Later that day, he noticed some increasing numbness to his right face and has noticed that it has become increasingly difficult to activate his right lip.  He denies using any more Orajel since Tuesday.  When the facial weakness persisted he came to our emergency department.  He denies any shortness of breath or difficulty handling his secretions.  He has no headache hearing or vision changes, weakness in any of his extremities or any numbness anywhere else.  He is a non-smoker nondrinker      Physical Exam   Triage Vital Signs: ED Triage Vitals [01/20/24 0850]  Encounter Vitals Group     BP (!) 151/94     Girls Systolic BP Percentile      Girls Diastolic BP Percentile      Boys Systolic BP Percentile      Boys Diastolic BP Percentile      Pulse Rate 76     Resp 16     Temp 98.1 F (36.7 C)     Temp Source Oral     SpO2 98 %     Weight 253 lb (114.8 kg)     Height 5' 5 (1.651 m)     Head Circumference      Peak Flow      Pain Score 0     Pain Loc      Pain Education      Exclude from Growth Chart     Most recent vital signs: Vitals:   01/20/24 1248 01/20/24 1459  BP:  132/67  Pulse:  60  Resp:  13  Temp: 97.6 F (36.4 C) 97.6 F (36.4 C)  SpO2:  96%    Nursing Triage Note reviewed. Vital signs reviewed and patients oxygen saturation is normoxic  General: Patient is well nourished, well developed,  awake and alert, resting comfortably in no acute distress Head: Normocephalic and atraumatic Eyes: Normal inspection, extraocular muscles intact, no conjunctival pallor Ear, nose, throat: Possible some slight fullness of his right lower face but no overlying erythema .  Floor mouth is soft handling secretions, no tongue or uvula edema.  He is wearing a partial and is a poor dentition.  I do not palpate any fluctuance along his right gingiva but he is missing many teeth and 1 tooth is cracked Neck: Normal range of motion Respiratory: Patient is in no respiratory distress, lungs CTAB Cardiovascular: Patient is not tachycardic, RRR without murmur appreciated GI: Abd SNT with no guarding or rebound  Back: Normal inspection of the back with good strength and range of motion throughout all ext Extremities: pulses intact with good cap refills, no LE pitting edema or calf tenderness Neuro: The patient is alert and oriented to person, place, and time, appropriately conversive, with 5/5 bilat UE/LE strength. Coordination appears to be adequate. Patient is able to furrow his brow and blow out his cheeks,  he has decreased activation of his right marginal mandibular nerve, he reports decreased sensation in that area of cranial nerve V as well Skin: Warm, dry, and intact Psych: normal mood and affect, no SI or HI  ED Results / Procedures / Treatments   Labs (all labs ordered are listed, but only abnormal results are displayed) Labs Reviewed  BASIC METABOLIC PANEL WITH GFR - Abnormal; Notable for the following components:      Result Value   Glucose, Bld 129 (*)    All other components within normal limits  CBC WITH DIFFERENTIAL/PLATELET  PROCALCITONIN     EKG EKG and rhythm strip are interpreted by myself:   EKG: [Normal sinus rhythm] at heart rate of 90, normal QRS duration, QTc 436, nonspecific ST segments and T waves no ectopy EKG not consistent with Acute STEMI Rhythm strip: NSR in lead  II   RADIOLOGY CT head without contrast: No acute abnormality on my independent review interpretation CTA head and neck without contrast: No acute abnormality CT face: Subcutaneous soft tissue swelling with air coming from the dentition on my independent review interpretation and radiologist agrees MRI brain: No acute CVA but 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    PROCEDURES:  Critical Care performed: No  Procedures   MEDICATIONS ORDERED IN ED: Medications  iohexol  (OMNIPAQUE ) 350 MG/ML injection 75 mL (75 mLs Intravenous Contrast Given 01/20/24 1025)  amoxicillin -clavulanate (AUGMENTIN ) 875-125 MG per tablet 1 tablet (1 tablet Oral Given 01/20/24 1205)     IMPRESSION / MDM / ASSESSMENT AND PLAN / ED COURSE                                Differential diagnosis includes, but is not limited to, dental infection, Orajel reaction, preshingles neuropathy, CVA,   ED course: Patient's history and exam is unclear to me whether this is a primarily dental issue with infection versus an intracranial/stroke issue.  He is certainly out of the window for any acute stroke alert/TNK.  Will obtain basic blood work and CT imaging of the head and face to consider next steps in diagnostics.  Patient currently resting comfortably   Clinical Course as of 01/20/24 1511  Thu Jan 20, 2024  0938 WBC: 10.4 No leukocytosis [HD]  1014 Basic metabolic panel(!) No profound electrolyte derangements creatinine is not elevated [HD]  1014 Procalcitonin No elevated Pro-Cal [HD]  1110 CT ANGIO HEAD NECK W WO CM No acute abnormality [HD]  1110 CT Maxillofacial W Contrast Some facial swelling and edema [HD]  1411 Reviewed the results of the workup with the patient and his daughter at bedside.  I handed out or copies of the reports of the imaging studies and explained that he needs to follow-up with primary care physician given the remote infarct but nothing acute.  Given that this is in the submandibular  region, we will start the patient on Augmentin  and Chlorahexidine mouth wash for facial cellulitis and dental infection.  Will give the patient chlorhexidine  and they will monitor for improvement [HD]  1510 MR BRAIN WO CONTRAST This was unremarkable for acute abnormality and patient was told about the possibility of an old CVA and he will follow-up with his primary care physician for medication optimization [HD]    Clinical Course User Index [HD] Nicholaus Rolland BRAVO, MD   At time of discharge there is no evidence of acute life, limb, vision, or fertility threat. Patient has  stable vital signs, pain is well controlled, patient is ambulatory and p.o. tolerant.  Discharge instructions were completed using the EPIC system. I would refer you to those at this time. All warnings prescriptions follow-up etc. were discussed in detail with the patient. Patient indicates understanding and is agreeable with this plan. All questions answered.  Patient is made aware that they may return to the emergency department for any worsening or new condition or for any other emergency.   -- Risk: 5 This patient has a high risk of morbidity due to further diagnostic testing or treatment. Rationale: This patient's evaluation and management involve a high risk of morbidity due to the potential severity of presenting symptoms, need for diagnostic testing, and/or initiation of treatment that may require close monitoring. The differential includes conditions with potential for significant deterioration or requiring escalation of care. Treatment decisions in the ED, including medication administration, procedural interventions, or disposition planning, reflect this level of risk. COPA: 5 The patient has the following acute or chronic illness/injury that poses a possible threat to life or bodily function: [X] : The patient has a potentially serious acute condition or an acute exacerbation of a chronic illness requiring urgent  evaluation and management in the Emergency Department. The clinical presentation necessitates immediate consideration of life-threatening or function-threatening diagnoses, even if they are ultimately ruled out.   FINAL CLINICAL IMPRESSION(S) / ED DIAGNOSES   Final diagnoses:  Pain, dental  Facial cellulitis  Facial numbness     Rx / DC Orders   ED Discharge Orders          Ordered    amoxicillin -clavulanate (AUGMENTIN ) 875-125 MG tablet  2 times daily        01/20/24 1413    chlorhexidine  (PERIDEX ) 0.12 % solution  2 times daily        01/20/24 1413    Ambulatory Referral to Primary Care (Establish Care)        01/20/24 1414             Note:  This document was prepared using Dragon voice recognition software and may include unintentional dictation errors.   Nicholaus Rolland BRAVO, MD 01/20/24 [phone]

## 2024-03-28 ENCOUNTER — Emergency Department

## 2024-03-28 ENCOUNTER — Emergency Department
Admission: EM | Admit: 2024-03-28 | Discharge: 2024-03-28 | Discharge status: Against Medical Advice | Attending: Emergency Medicine | Admitting: Emergency Medicine

## 2024-03-28 ENCOUNTER — Other Ambulatory Visit: Payer: Self-pay

## 2024-03-28 ENCOUNTER — Emergency Department: Admission: EM | Admit: 2024-03-28 | Discharge: 2024-03-28 | Disposition: A | Discharge status: Home / Self Care

## 2024-03-28 DIAGNOSIS — W19XXXA Unspecified fall, initial encounter: Secondary | ICD-10-CM

## 2024-03-28 DIAGNOSIS — M25511 Pain in right shoulder: Secondary | ICD-10-CM | POA: Insufficient documentation

## 2024-03-28 DIAGNOSIS — S4991XA Unspecified injury of right shoulder and upper arm, initial encounter: Secondary | ICD-10-CM | POA: Insufficient documentation

## 2024-03-28 DIAGNOSIS — Z5321 Procedure and treatment not carried out due to patient leaving prior to being seen by health care provider: Secondary | ICD-10-CM | POA: Insufficient documentation

## 2024-03-28 DIAGNOSIS — S4992XA Unspecified injury of left shoulder and upper arm, initial encounter: Secondary | ICD-10-CM | POA: Insufficient documentation

## 2024-03-28 DIAGNOSIS — W000XXA Fall on same level due to ice and snow, initial encounter: Secondary | ICD-10-CM | POA: Insufficient documentation

## 2024-03-28 DIAGNOSIS — M25512 Pain in left shoulder: Secondary | ICD-10-CM | POA: Insufficient documentation

## 2024-03-28 NOTE — Discharge Instructions (Signed)
 The x-rays of your shoulder did not show any fractures.  You may have some soreness for a couple days after the fall.  This is normal.  If your pain persists for longer than a week please follow-up with orthopedics as information is attached.  You can take 650 mg of Tylenol and 600 mg of ibuprofen every 6 hours as needed for pain. You can use ice, heat, muscle creams and other topical pain relievers as well.

## 2024-03-28 NOTE — ED Notes (Signed)
 Pt verbalized understanding of discharge instructions. Opportunity for questions provided.

## 2024-03-28 NOTE — ED Triage Notes (Signed)
 Pt comes with shoulder pain. Pt was here and states he didn't hear anyone call for him after he had his xrays.

## 2024-03-28 NOTE — ED Notes (Signed)
 Pt approached desk and states he was still waiting for his results. Pt states he has been waiting in lobby. Per record pt was called multiple times and then taken off board. Pt still wanting to be seen.
